# Patient Record
Sex: Female | Born: 1970 | Hispanic: Yes | Marital: Married | Smoking: Never smoker
Health system: Southern US, Community
[De-identification: ages and names within clinical notes are randomized; demographics above are authoritative.]

## PROBLEM LIST (undated history)

## (undated) DIAGNOSIS — E119 Type 2 diabetes mellitus without complications: Secondary | ICD-10-CM

## (undated) HISTORY — PX: CHOLECYSTECTOMY: SHX55

## (undated) HISTORY — PX: DILATION AND CURETTAGE OF UTERUS: SHX78

---

## 2017-07-14 ENCOUNTER — Other Ambulatory Visit (HOSPITAL_COMMUNITY): Payer: Self-pay | Admitting: Obstetrics and Gynecology

## 2017-07-14 DIAGNOSIS — Z3689 Encounter for other specified antenatal screening: Secondary | ICD-10-CM

## 2017-07-14 DIAGNOSIS — N888 Other specified noninflammatory disorders of cervix uteri: Secondary | ICD-10-CM

## 2017-07-14 DIAGNOSIS — Z3A16 16 weeks gestation of pregnancy: Secondary | ICD-10-CM

## 2017-07-24 ENCOUNTER — Encounter (HOSPITAL_COMMUNITY): Payer: Self-pay | Admitting: *Deleted

## 2017-07-25 ENCOUNTER — Ambulatory Visit (HOSPITAL_COMMUNITY)
Admission: RE | Admit: 2017-07-25 | Discharge: 2017-07-25 | Disposition: A | Discharge status: Home / Self Care | Payer: Medicaid Other | Source: Ambulatory Visit | Attending: Obstetrics and Gynecology | Admitting: Obstetrics and Gynecology

## 2017-07-25 ENCOUNTER — Other Ambulatory Visit (HOSPITAL_COMMUNITY): Payer: Self-pay | Admitting: Obstetrics and Gynecology

## 2017-07-25 ENCOUNTER — Encounter (HOSPITAL_COMMUNITY): Payer: Self-pay

## 2017-07-25 DIAGNOSIS — O99212 Obesity complicating pregnancy, second trimester: Secondary | ICD-10-CM | POA: Insufficient documentation

## 2017-07-25 DIAGNOSIS — Z363 Encounter for antenatal screening for malformations: Secondary | ICD-10-CM | POA: Insufficient documentation

## 2017-07-25 DIAGNOSIS — Z3689 Encounter for other specified antenatal screening: Secondary | ICD-10-CM

## 2017-07-25 DIAGNOSIS — N888 Other specified noninflammatory disorders of cervix uteri: Secondary | ICD-10-CM

## 2017-07-25 DIAGNOSIS — Z3A16 16 weeks gestation of pregnancy: Secondary | ICD-10-CM | POA: Insufficient documentation

## 2017-07-25 DIAGNOSIS — O09522 Supervision of elderly multigravida, second trimester: Secondary | ICD-10-CM | POA: Diagnosis not present

## 2017-07-25 DIAGNOSIS — O34219 Maternal care for unspecified type scar from previous cesarean delivery: Secondary | ICD-10-CM

## 2017-07-25 DIAGNOSIS — O24112 Pre-existing diabetes mellitus, type 2, in pregnancy, second trimester: Secondary | ICD-10-CM | POA: Diagnosis not present

## 2017-07-25 DIAGNOSIS — O2692 Pregnancy related conditions, unspecified, second trimester: Secondary | ICD-10-CM | POA: Diagnosis not present

## 2017-07-25 HISTORY — DX: Type 2 diabetes mellitus without complications: E11.9

## 2017-07-25 NOTE — Consult Note (Signed)
Maternal Fetal Medicine Consultation  Requesting Provider(s): Gaccione  Primary OB: Gaccione Reason for consultation: Cervical mass  HPI: 47 year old Hispanic female P 11 0 3 9 with AMA, Type 2 DM, referred for a mass that appears to be protruding from the cervix. She has not yet had any testing for aneuploidy. She states that the mass does not hurt but she has bleeding when she has to sneeze, cough, or after any pelvic examinations. OB History: OB History    Gravida Para Term Preterm AB Living   15 11 11   3 9    SAB TAB Ectopic Multiple Live Births   2 1     11       PMH:  Past Medical History:  Diagnosis Date  . Diabetes mellitus without complication (HCC)     PSH:  Past Surgical History:  Procedure Laterality Date  . CESAREAN SECTION    . CHOLECYSTECTOMY    . DILATION AND CURETTAGE OF UTERUS     Meds: PNV, metformin Allergies: NKDA FH: See EPIC section Soc: See EPIC section  Review of Systems: no vaginal bleeding at present, nor cramping/contractions, no LOF, no nausea/vomiting. All other systems reviewed and are negative.  PE:  VS: See EPIC section GEN: well-appearing female ABD: gravid, NT Pelvic: a speculum exam was performed. A 2-3 cm mass is seen protruding from the cervical os. It is not bleeding. It is soft and slightly friable  Please see separate document for fetal ultrasound report.  A/P: 1. Probable cervical polyp The base of this polyp is not fully visible and would be difficult to manage if the polyp was removed. At this time I would not intervene. If bleeding becomes an issue, it could be "lasso'ed" with several chromic endoloops around the stalk and excised. It may continue to cause nuisance bleeding but should not interfere with the pregnancy excessively. 2. AMA with worrisome US findings This patient has a baseline 1 in 16 risk for aneuploidy due to age. The thickened nuchal translucency and possible VSD greatly increase this risk. The risk for  cardiac defect is of course increased in a patient with type 2 DM. I have taken the liberty of scheduling a fetal echocardiogram to assess the fetal heart. I discussed the findings with the patient and her husband. They had previously declined aneuploidy testing per report, but are willing to undergo cell free DNA testing today. We will go ahead and draw a Panorama non-invasive test today.  I have asked her to follow up in 3 weeks for repeat scan. I have also made a genetic counseling appointment at that visit just in case the NIPT is positive for an aneuploidy  Thank you for the opportunity to be a part of the care of Rose Atkinson. Please contact our office if we can be of further assistance.   I spent approximately 30 minutes with this patient with over 50% of time spent in face-to-face counseling.

## 2017-07-25 NOTE — Addendum Note (Signed)
Encounter addended by: Vanetta ShawlIsley, Diamone Whistler H, RT, RVT, RDMS on: 07/25/2017 4:17 PM  Actions taken: Imaging Exam ended

## 2017-07-26 ENCOUNTER — Other Ambulatory Visit (HOSPITAL_COMMUNITY): Payer: Self-pay | Admitting: *Deleted

## 2017-07-26 DIAGNOSIS — O359XX0 Maternal care for (suspected) fetal abnormality and damage, unspecified, not applicable or unspecified: Secondary | ICD-10-CM

## 2017-07-29 ENCOUNTER — Other Ambulatory Visit: Payer: Self-pay

## 2017-08-04 ENCOUNTER — Other Ambulatory Visit: Payer: Self-pay

## 2017-08-04 ENCOUNTER — Telehealth (HOSPITAL_COMMUNITY): Payer: Self-pay | Admitting: MS"

## 2017-08-04 NOTE — Telephone Encounter (Signed)
Called Rose Atkinson with results of noninvasive prenatal screening (NIPS)/prenatal cell free DNA testing. Spanish/English medical interpreter [phone]#[id number] was present to provided interpretation as needed, but the patient indicated that she understood [place]. Patient was identified by name and DOB. [name] had NIPS given maternal age and abnormal ultrasound findings. We discussed that results of Panorama through [facility] are high risk for Trisomy 21 (Down syndrome). We reviewed that while not diagnostic, given the sensitivity and specificity of this test, the positive predictive value for this result in the current pregnancy is estimated to be >99%. Analysis for additional aneuploidy conditions was within normal limits, indicating low risk for trisomy 5018, trisomy 13, monosomy X, and 22q11.2 deletion syndrome. Additionally, NIPS indicated female fetal sex; the patient did want to know this information.  [name] is scheduled for follow-up ultrasound and genetic counseling in our office on 08/15/17. I offered her an earlier genetic counseling visit to review these results in more detail and to discuss available resources and available follow-up options. [name] declined a sooner appointment and preferred to wait for the 08/15/17 visit. We discussed one available resource, National Down Syndrome Congress, if she or relatives wanted to read more about Down syndrome prior to the appointment. She was encouraged to call back with questions or concerns.   Clydie BraunKaren Adylynn Hertenstein 08/04/2017 2:42 PM

## 2017-08-15 ENCOUNTER — Ambulatory Visit (HOSPITAL_COMMUNITY)
Admission: RE | Admit: 2017-08-15 | Discharge: 2017-08-15 | Disposition: A | Discharge status: Home / Self Care | Payer: Medicaid Other | Source: Ambulatory Visit | Attending: Obstetrics and Gynecology | Admitting: Obstetrics and Gynecology

## 2017-08-15 ENCOUNTER — Other Ambulatory Visit (HOSPITAL_COMMUNITY): Payer: Self-pay | Admitting: *Deleted

## 2017-08-15 ENCOUNTER — Encounter (HOSPITAL_COMMUNITY): Payer: Self-pay

## 2017-08-15 DIAGNOSIS — O34219 Maternal care for unspecified type scar from previous cesarean delivery: Secondary | ICD-10-CM | POA: Insufficient documentation

## 2017-08-15 DIAGNOSIS — O26892 Other specified pregnancy related conditions, second trimester: Secondary | ICD-10-CM | POA: Diagnosis not present

## 2017-08-15 DIAGNOSIS — Z7984 Long term (current) use of oral hypoglycemic drugs: Secondary | ICD-10-CM | POA: Diagnosis not present

## 2017-08-15 DIAGNOSIS — O3510X0 Maternal care for (suspected) chromosomal abnormality in fetus, unspecified, not applicable or unspecified: Secondary | ICD-10-CM | POA: Insufficient documentation

## 2017-08-15 DIAGNOSIS — Z362 Encounter for other antenatal screening follow-up: Secondary | ICD-10-CM | POA: Diagnosis not present

## 2017-08-15 DIAGNOSIS — O281 Abnormal biochemical finding on antenatal screening of mother: Secondary | ICD-10-CM | POA: Insufficient documentation

## 2017-08-15 DIAGNOSIS — O359XX0 Maternal care for (suspected) fetal abnormality and damage, unspecified, not applicable or unspecified: Secondary | ICD-10-CM | POA: Diagnosis not present

## 2017-08-15 DIAGNOSIS — O99212 Obesity complicating pregnancy, second trimester: Secondary | ICD-10-CM | POA: Diagnosis not present

## 2017-08-15 DIAGNOSIS — Z3A19 19 weeks gestation of pregnancy: Secondary | ICD-10-CM | POA: Insufficient documentation

## 2017-08-15 DIAGNOSIS — N889 Noninflammatory disorder of cervix uteri, unspecified: Secondary | ICD-10-CM | POA: Insufficient documentation

## 2017-08-15 DIAGNOSIS — E669 Obesity, unspecified: Secondary | ICD-10-CM | POA: Insufficient documentation

## 2017-08-15 DIAGNOSIS — O09522 Supervision of elderly multigravida, second trimester: Secondary | ICD-10-CM | POA: Diagnosis not present

## 2017-08-15 DIAGNOSIS — O289 Unspecified abnormal findings on antenatal screening of mother: Secondary | ICD-10-CM

## 2017-08-15 DIAGNOSIS — O351XX0 Maternal care for (suspected) chromosomal abnormality in fetus, not applicable or unspecified: Secondary | ICD-10-CM | POA: Insufficient documentation

## 2017-08-15 DIAGNOSIS — O24112 Pre-existing diabetes mellitus, type 2, in pregnancy, second trimester: Secondary | ICD-10-CM | POA: Insufficient documentation

## 2017-08-15 DIAGNOSIS — O09292 Supervision of pregnancy with other poor reproductive or obstetric history, second trimester: Secondary | ICD-10-CM | POA: Diagnosis not present

## 2017-08-15 NOTE — Progress Notes (Signed)
Genetic Counseling  High-Risk Gestation Note  Appointment Date:  08/15/2017 Referred By: Hassell Done, MD Date of Birth:  04/27/1971 Partner:  Rose Atkinson   Pregnancy History: [id number] Estimated Date of Delivery: 01/09/18 Estimated Gestational Age: [redacted]w[redacted]d Attending: Charlsie Merles, MD   Rose Atkinson and her partner, Mr. Rose Atkinson, were seen for genetic counseling because of a high risk Trisomy 21 result from noninvasive prenatal screening (NIPS)/prenatal cell free DNA testing. The patient will be 47 years old at delivery. The patient declined the use of medical interpreter. The couple's 57 year old daughter was also present for today's visit.     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)  Positive Predictive Value approximately >99%  Reviewed results of ultrasound: suspected fetal heart defect, increased nuchal fold, short long bones  Discussed additional screening options  Ongoing Ultrasound  Fetal Echocardiogram- scheduled 2/21 [facility])  Discussed diagnostic testing options  Amniocentesis- declined  Postnatal chromosome analysis- couple would like to pursue this option  Reviewed family history concerns  Discussed ACOG recommended carrier screening options- declined  Rose Atkinson was previously [facility]. Ultrasound at that time visualized increased nuchal fold measurement and suspected fetal heart defect. Given ultrasound findings and advanced maternal age, the patient was offered and elected to pursue noninvasive prenatal screening (NIPS)/prenatal cell free DNA testing. We reviewed that results of this screening, Panorama through [facility] laboratory, indicated high risk for Trisomy 21 in the current pregnancy.   Follow-up ultrasound was performed today. Increased  nuchal fold measurement and shortened fetal long bones were visualized. Suspected fetal ventricular septal defect (VSD) was again visualized. Complete ultrasound results reported under separate cover. Follow-up ultrasound scheduled 09/11/17, and fetal echocardiogram with [facility] is scheduled for 09/07/17.   We spent time reviewing the methodology of NIPS, including the sensitivity and specificity. The patient's high risk Trisomy 21 result is reported as a 9 in 10 risk. We discussed that this testing identifies >99% of pregnancies with Down syndrome with a false positive rate of approximately 0.1%. We reviewed that this testing is highly sensitive and specific, but is not considered diagnostic. We reviewed that the positive predictive value (PPV) is the percentage of those patients who have a positive NIPS result who actually have fetal Down syndrome. Specific to this laboratory and the patient's age, the PPV of this result is >99%. We spend significant time reviewing this technology and the accuracy of NIPS and other screening versus diagnostic tests.  They were counseled that given the ultrasound findings and the positive cell free DNA test result, the suspected diagnosis of Down syndrome is likely accurate. We reviewed that the prenatal cell free DNA test can not distinguish between aneuploidy confined to the placenta, trisomy 21, translocation 21, or mosaic trisomy 21. We discussed the availability of amniocentesis or peripheral blood chromosome analysis for confirmation of the diagnosis. We reviewed risks, benefits, and limitations of amniocentesis including the associate risk for complications. This couple declined the option of amniocentesis. They wish to pursue postnatal confirmatory testing.   Considering the very high suspicion of Down syndrome, they were counseled in detail regarding this diagnosis. We discussed that there are different types of Down syndrome, and each type is  determined by the arrangement of the #21 chromosomes. Approximately 95% of cases of Down syndrome are trisomy 50 and  2-4% are due to a translocation involving chromosome 21. We reviewed chromosomes, nondisjunction, and that chromosome division errors happen by chance and are not usually inherited. They understand that confirmatory testing will provide an actual karyotype and will help to determine accurate risks of recurrence for a future pregnancy.   This couple was counseled that Down syndrome occurs once per every 800 births and is associated with specific features. However, there is a high degree of variability seen among children who have this condition, meaning that every child with Down syndrome will not be affected in exactly the same way, and some children will have more or less features than others. They were counseled that half of individuals with Down syndrome have a cardiac anomaly and ~10-15% have an intestinal difference. Other anomalies of various organ systems have also been described in association with this diagnosis.   We discussed that in general most individuals with Down syndrome have mild to moderate intellectual disability and likely will require extra assistance with school work. Approximately 70-80% of children with Down syndrome have hypotonia which may lead to delays in sitting, walking, and talking. Hypotonia does tend to improve with age and early intervention services such as physical, occupational, and speech therapies can help with achievement of developmental milestones. We discussed that these therapies are typically provided to children (with qualifying diagnoses) from birth until age 413. We also discussed that Down syndrome is associated with characteristic facial features. Because of these facial features, many children with Down syndrome look similar to each other, but they were reminded that each child with Down syndrome is unique and will have many more features in common  with his or her own family members.   We also reviewed that the AAP have established health supervision guidelines for individuals with Down syndrome. These guidelines provide medical management recommendations for various stages of life and can be a resource for families who have a child with Down syndrome as well as for health professionals. We also discussed that providing children with Down syndrome with a stimulating physical and social environment, as well as ensuring that the child receives adequate medical care and appropriate therapies, will help these children to reach their full potential. With the advances in medical technology, early intervention, and supportive therapies, many individuals with Down syndrome are able to live with an increasing degree of independence. Today, many adults with Down syndrome care for themselves, have jobs, and often times live in group homes or apartments where assistance is available if needed.   We discussed local and national support organizations and provided written resources in [place] and Spanish to the couple. Additionally, we discussed that postnatal health management can be coordinated by a medical geneticist as well as with a multidisciplinary team of physicians (Down syndrome clinic). We also discussed options of terminating the pregnancy, adoption, or continuing the pregnancy. The couple plans to continue the pregnancy and are not considering other options. This couple was encouraged to contact us if they have any additional questions/concerns or if they need additional supportive or informational recommendations.   Ms. Michail Jewelsomasa Finfrock was provided with written information regarding cystic fibrosis (CF), spinal muscular atrophy (SMA) and hemoglobinopathies including the carrier frequency, availability of carrier screening and prenatal diagnosis if indicated.  In addition, we discussed that CF and hemoglobinopathies are routinely screened for as part of  the [place] newborn screening panel.  She declined screening for CF, SMA and hemoglobinopathies.  Both family histories were reviewed and found to be contributory  for the patient's first child with hydrocephalus who died at age 47 months. She reported that her next pregnancy with that same partner was identified on prenatal ultrasound to have abnormal cerebral ventricles, and the pregnancy was terminated. She reported no additional children with hydrocephalus. The patient had one son, with a different partner from the previously mentioned partner, who died at age 33 month due to whooping cough. She reported that her remaining 8 children are in good health.   Hydrocephalus can be isolated (nonsyndromic) or seen as one feature of an underlying chromosome or genetic condition. Hydrocephalus is typically isolated and multifactorial, involving a combination of genetic and environmental contributing factors.  Rarely, nonsyndromic hydrocephalus can follow autosomal recessive or autosomal dominant inheritance. X-linked hydrocephalus is also observed in some families. We discussed that when isolated and when multifactorial inheritance is suspected, recurrence risk for full siblings is approximately 1-2%. Additional information regarding an underlying cause for hydrocephalus in the family may alter recurrence risk assessment. Targeted ultrasound is available to assess for features of hydrocephalus prenatally. However, the patient understands that ultrasound cannot diagnose or rule out all birth defects prenatally.   The patient also reported a niece (her sister's daughter) born with only one kidney. The niece is reportedly healthy. No additional relatives were reported with congenital defects of the kidney. Renal agenesis is typically sporadic. However, there are familial cases reported that are consistent with autosomal dominant inheritance. Additionally, prenatal exposures such as warfarin, cocaine, and maternal diabetes  have been reported to be associated with renal agenesis. The reported family history would most likely indicate low recurrence risk for the patient's offspring for renal agenesis. Without further information regarding the provided family history, an accurate genetic risk cannot be calculated. Further genetic counseling is warranted if more information is obtained.  Ms. Darra Rosa denied exposure to environmental toxins or chemical agents. She denied the use of alcohol, tobacco or street drugs. She denied significant viral illnesses during the course of her pregnancy.  I counseled this couple regarding the above risks and available options.  The approximate face-to-face time with the genetic counselor was 45 minutes.  Quinn Plowman, MS,  Certified Genetic Counselor 08/15/2017

## 2017-08-15 NOTE — Addendum Note (Signed)
Encounter addended by: Melynda KellerVics, Lavonte Palos R, RDMS on: 08/15/2017 11:22 AM  Actions taken: Imaging Exam ended

## 2017-09-07 ENCOUNTER — Encounter (HOSPITAL_COMMUNITY): Payer: Self-pay

## 2017-09-11 ENCOUNTER — Inpatient Hospital Stay (HOSPITAL_COMMUNITY)
Admission: RE | Admit: 2017-09-11 | Discharge: 2017-09-11 | Disposition: A | Discharge status: Home / Self Care | Payer: Medicaid Other | Source: Ambulatory Visit

## 2017-09-13 ENCOUNTER — Encounter (HOSPITAL_COMMUNITY): Payer: Self-pay

## 2017-09-13 ENCOUNTER — Other Ambulatory Visit (HOSPITAL_COMMUNITY): Payer: Self-pay | Admitting: Obstetrics and Gynecology

## 2017-09-13 ENCOUNTER — Ambulatory Visit (HOSPITAL_COMMUNITY)
Admission: RE | Admit: 2017-09-13 | Discharge: 2017-09-13 | Disposition: A | Discharge status: Home / Self Care | Payer: Medicaid Other | Source: Ambulatory Visit | Attending: Obstetrics and Gynecology | Admitting: Obstetrics and Gynecology

## 2017-09-13 DIAGNOSIS — E119 Type 2 diabetes mellitus without complications: Secondary | ICD-10-CM | POA: Insufficient documentation

## 2017-09-13 DIAGNOSIS — N889 Noninflammatory disorder of cervix uteri, unspecified: Secondary | ICD-10-CM | POA: Insufficient documentation

## 2017-09-13 DIAGNOSIS — O289 Unspecified abnormal findings on antenatal screening of mother: Secondary | ICD-10-CM

## 2017-09-13 DIAGNOSIS — O24112 Pre-existing diabetes mellitus, type 2, in pregnancy, second trimester: Secondary | ICD-10-CM

## 2017-09-13 DIAGNOSIS — O09522 Supervision of elderly multigravida, second trimester: Secondary | ICD-10-CM | POA: Diagnosis not present

## 2017-09-13 DIAGNOSIS — O281 Abnormal biochemical finding on antenatal screening of mother: Secondary | ICD-10-CM

## 2017-09-13 DIAGNOSIS — O26892 Other specified pregnancy related conditions, second trimester: Secondary | ICD-10-CM | POA: Diagnosis not present

## 2017-09-13 DIAGNOSIS — O99210 Obesity complicating pregnancy, unspecified trimester: Secondary | ICD-10-CM

## 2017-09-13 DIAGNOSIS — Z362 Encounter for other antenatal screening follow-up: Secondary | ICD-10-CM | POA: Diagnosis not present

## 2017-09-13 DIAGNOSIS — Z3A23 23 weeks gestation of pregnancy: Secondary | ICD-10-CM | POA: Insufficient documentation

## 2017-09-13 DIAGNOSIS — Z794 Long term (current) use of insulin: Secondary | ICD-10-CM | POA: Diagnosis not present

## 2017-09-13 DIAGNOSIS — Z363 Encounter for antenatal screening for malformations: Secondary | ICD-10-CM

## 2017-09-13 DIAGNOSIS — O99212 Obesity complicating pregnancy, second trimester: Secondary | ICD-10-CM | POA: Diagnosis not present

## 2017-09-13 DIAGNOSIS — O09292 Supervision of pregnancy with other poor reproductive or obstetric history, second trimester: Secondary | ICD-10-CM | POA: Diagnosis not present

## 2017-09-13 DIAGNOSIS — E669 Obesity, unspecified: Secondary | ICD-10-CM | POA: Insufficient documentation

## 2017-09-13 DIAGNOSIS — O34219 Maternal care for unspecified type scar from previous cesarean delivery: Secondary | ICD-10-CM | POA: Diagnosis not present

## 2017-09-13 DIAGNOSIS — O359XX0 Maternal care for (suspected) fetal abnormality and damage, unspecified, not applicable or unspecified: Secondary | ICD-10-CM | POA: Insufficient documentation

## 2017-09-14 ENCOUNTER — Other Ambulatory Visit (HOSPITAL_COMMUNITY): Payer: Self-pay | Admitting: *Deleted

## 2017-09-14 DIAGNOSIS — O289 Unspecified abnormal findings on antenatal screening of mother: Secondary | ICD-10-CM

## 2017-10-12 ENCOUNTER — Ambulatory Visit (HOSPITAL_COMMUNITY)
Admission: RE | Admit: 2017-10-12 | Discharge: 2017-10-12 | Disposition: A | Discharge status: Home / Self Care | Payer: Medicaid Other | Source: Ambulatory Visit | Attending: Obstetrics and Gynecology | Admitting: Obstetrics and Gynecology

## 2017-10-12 ENCOUNTER — Other Ambulatory Visit (HOSPITAL_COMMUNITY): Payer: Self-pay | Admitting: Obstetrics and Gynecology

## 2017-10-12 ENCOUNTER — Encounter (HOSPITAL_COMMUNITY): Payer: Self-pay

## 2017-10-12 DIAGNOSIS — O34219 Maternal care for unspecified type scar from previous cesarean delivery: Secondary | ICD-10-CM | POA: Insufficient documentation

## 2017-10-12 DIAGNOSIS — O289 Unspecified abnormal findings on antenatal screening of mother: Secondary | ICD-10-CM | POA: Insufficient documentation

## 2017-10-12 DIAGNOSIS — O09292 Supervision of pregnancy with other poor reproductive or obstetric history, second trimester: Secondary | ICD-10-CM | POA: Insufficient documentation

## 2017-10-12 DIAGNOSIS — O09522 Supervision of elderly multigravida, second trimester: Secondary | ICD-10-CM | POA: Diagnosis not present

## 2017-10-12 DIAGNOSIS — E669 Obesity, unspecified: Secondary | ICD-10-CM | POA: Diagnosis not present

## 2017-10-12 DIAGNOSIS — O358XX0 Maternal care for other (suspected) fetal abnormality and damage, not applicable or unspecified: Secondary | ICD-10-CM | POA: Insufficient documentation

## 2017-10-12 DIAGNOSIS — E119 Type 2 diabetes mellitus without complications: Secondary | ICD-10-CM | POA: Diagnosis not present

## 2017-10-12 DIAGNOSIS — O24112 Pre-existing diabetes mellitus, type 2, in pregnancy, second trimester: Secondary | ICD-10-CM | POA: Insufficient documentation

## 2017-10-12 DIAGNOSIS — Z3A27 27 weeks gestation of pregnancy: Secondary | ICD-10-CM | POA: Diagnosis not present

## 2017-10-12 DIAGNOSIS — N889 Noninflammatory disorder of cervix uteri, unspecified: Secondary | ICD-10-CM | POA: Diagnosis not present

## 2017-10-12 DIAGNOSIS — O99212 Obesity complicating pregnancy, second trimester: Secondary | ICD-10-CM | POA: Diagnosis not present

## 2017-10-12 DIAGNOSIS — O26892 Other specified pregnancy related conditions, second trimester: Secondary | ICD-10-CM | POA: Insufficient documentation

## 2017-10-12 NOTE — Addendum Note (Signed)
Encounter addended by: Jaystin Mcgarvey, RDMS on: 10/12/2017 12:44 PM  Actions taken: Imaging Exam ended

## 2017-11-08 ENCOUNTER — Ambulatory Visit (HOSPITAL_COMMUNITY): Admission: RE | Admit: 2017-11-08 | Payer: Medicaid Other | Source: Ambulatory Visit

## 2018-01-16 ENCOUNTER — Encounter (HOSPITAL_COMMUNITY): Payer: Self-pay

## 2018-04-03 ENCOUNTER — Encounter (HOSPITAL_COMMUNITY): Payer: Self-pay

## 2018-12-22 IMAGING — US US MFM OB TRANSVAGINAL
1 series · 13 of 28 positions shown · non-contrast
Comparison: none

[Series 1: us mfm ob transvaginal · 13 of 77 slices shown]
[im 3/77]
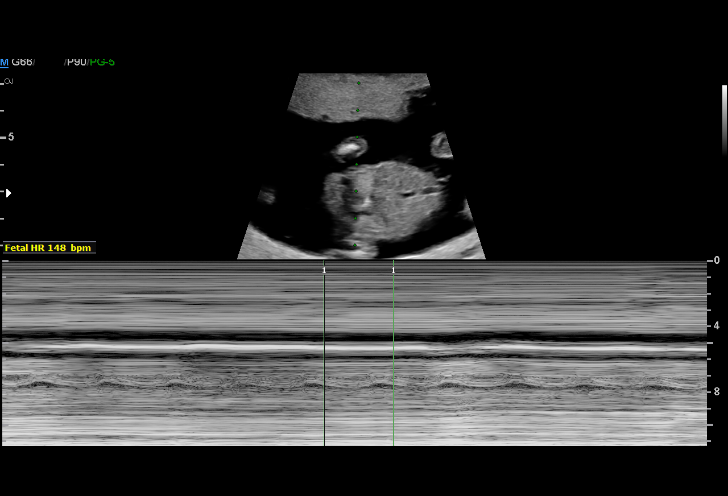
[im 9/77]
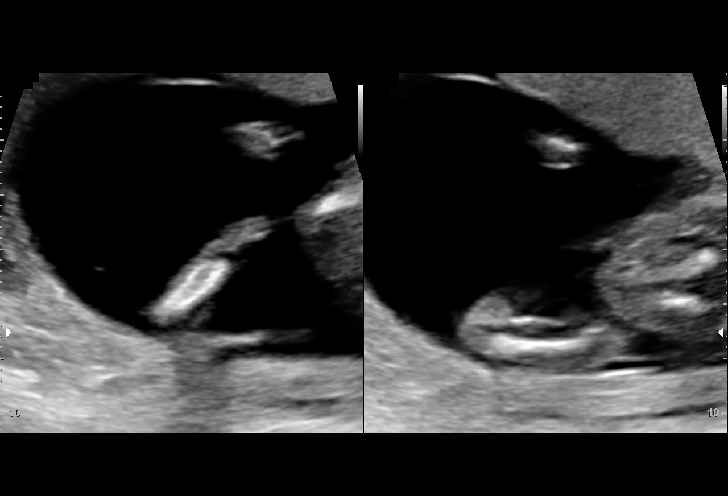
[im 15/77]
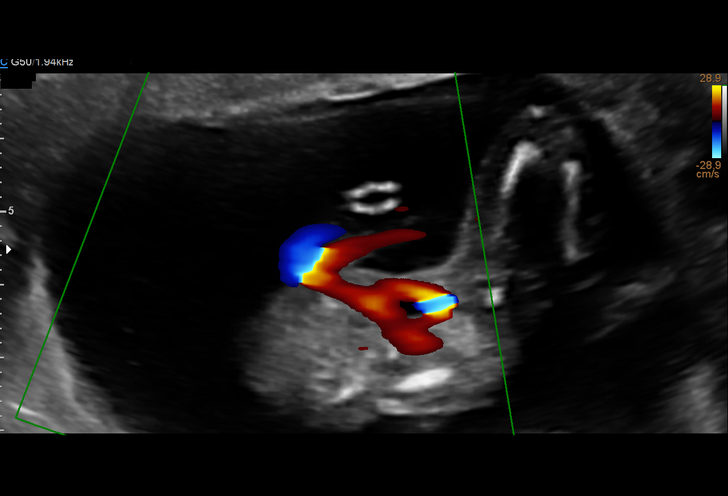
[im 20/77]
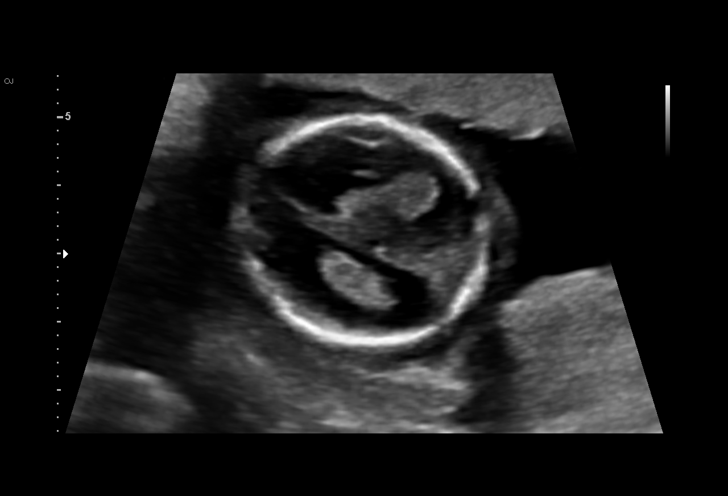
[im 26/77]
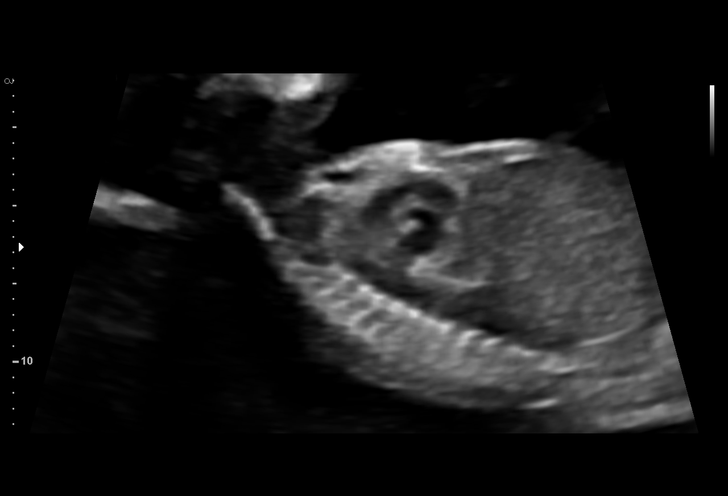
[im 31/77]
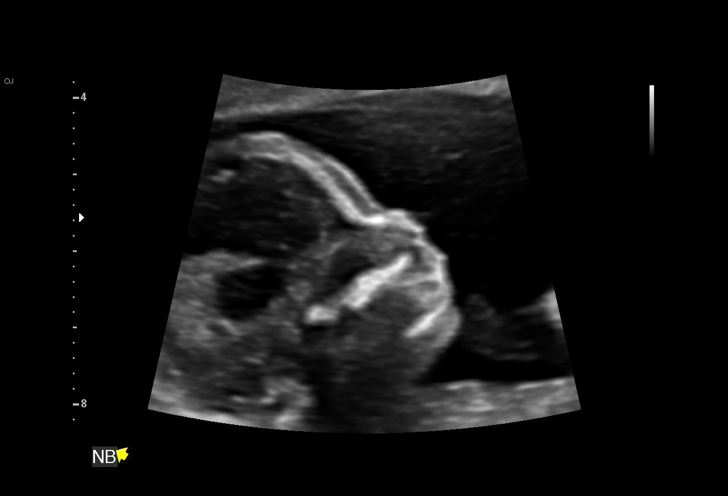
[im 40/77]
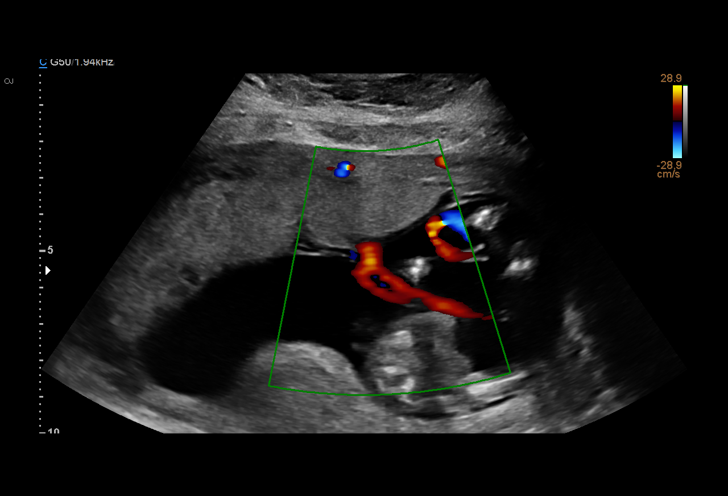
[im 46/77]
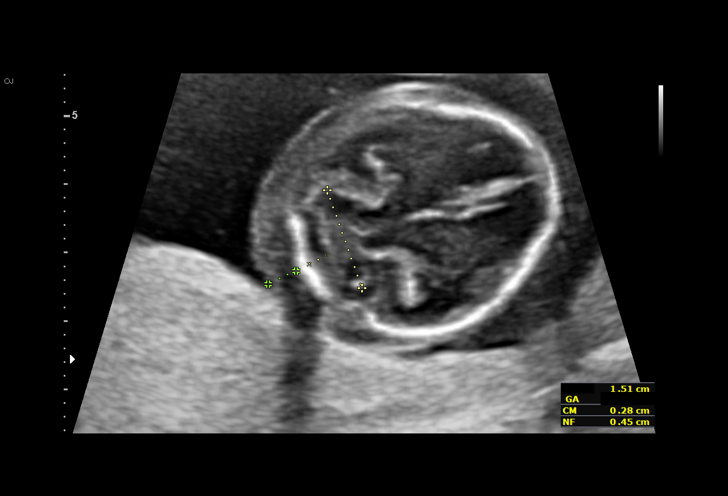
[im 51/77]
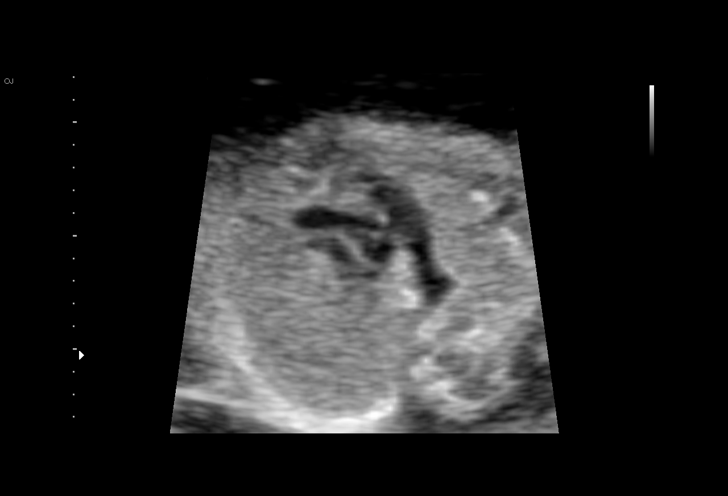
[im 57/77]
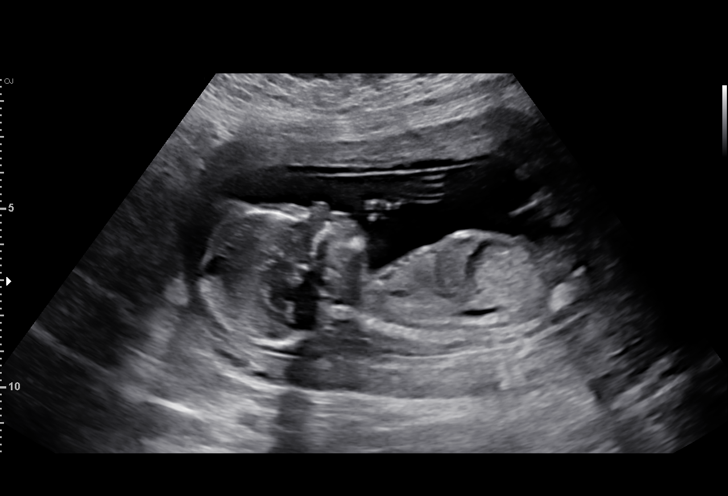
[im 62/77]
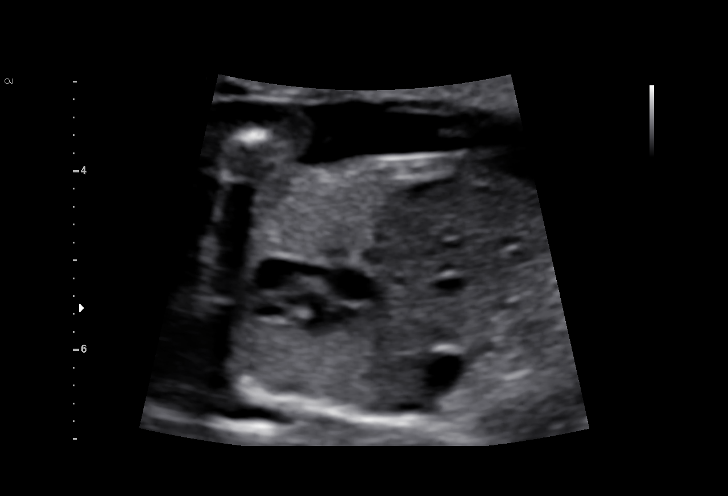
[im 68/77]
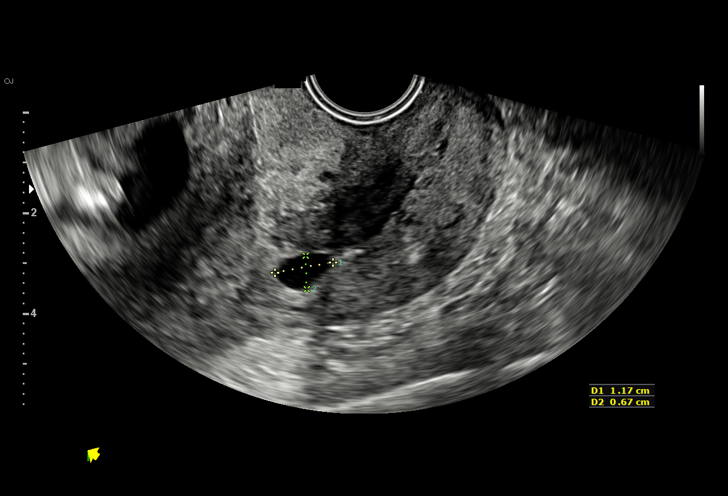
[im 74/77]
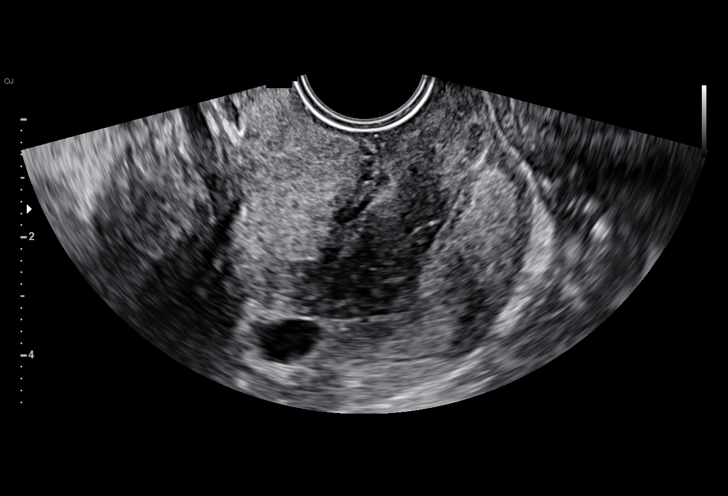

[13 of 28 positions shown; findings below may reference images not displayed]

1  JUWAIDI TEON           [id number]      [id number]     [id number]
2  JUWAIDI TEON           [id number]      [id number]     [id number]
Indications

16 weeks gestation of pregnancy
Encounter for antenatal screening for
malformations
Advanced maternal age multigravida (46),
second trimester -declined testing
Obesity complicating pregnancy, second
trimester
Medical complication of pregnancy (cervical
mass)
Pre-existing diabetes, type 2, in pregnancy,
second trimester -metformin
Previous cesarean delivery, antepartum x3
OB History

Blood Type:            Height:  5'1"   Weight (lb):  187       BMI:
Gravidity:    14        Term:   11       Prem:   0        SAB:   2
TOP:          1       Ectopic:  0        Living: 9
Fetal Evaluation

Num Of Fetuses:     1
Fetal Heart         148
Rate(bpm):
Cardiac Activity:   Observed
Presentation:       Variable
Placenta:           Anterior, above cervical os
P. Cord Insertion:  Visualized
Amniotic Fluid
AFI FV:      Subjectively within normal limits

Largest Pocket(cm)
5.4
Biometry

BPD:      32.5  mm     G. Age:  16w 1d         53  %    CI:        81.41   %    70 - 86
FL/HC:      15.0   %    13.3 -
HC:      113.7  mm     G. Age:  15w 4d         15  %    HC/AC:      1.04        1.05 -
AC:      109.1  mm     G. Age:  16w 6d         77  %    FL/BPD:     52.6   %
FL:       17.1  mm     G. Age:  15w 0d         13  %    FL/AC:      15.7   %    20 - 24
HUM:      17.2  mm     G. Age:  15w 0d         19  %
CER:      14.8  mm     G. Age:  14w 6d         23  %
NFT:       5.5  mm
CM:        2.9  mm

Est. FW:     139  gm      0 lb 5 oz     66  %
Gestational Age

U/S Today:     15w 6d                                        EDD:   01/10/18
Best:          16w 0d     Det. By:  Early Ultrasound         EDD:   01/09/18
(05/23/17)
Anatomy

Cranium:               Appears normal         Aortic Arch:            Not well visualized
Cavum:                 Appears normal         Ductal Arch:            Appears normal
Ventricles:            Appears normal         Diaphragm:              Appears normal
Choroid Plexus:        Appears normal         Stomach:                Appears normal, left
sided
Cerebellum:            Appears normal         Abdomen:                Appears normal
Posterior Fossa:       Appears normal         Abdominal Wall:         Appears nml (cord
insert, abd wall)
Nuchal Fold:           Appears enlarged       Cord Vessels:           Appears normal (3
vessel cord)
Face:                  Appears normal         Kidneys:                Appear normal
(orbits and profile)
Lips:                  Not well visualized    Bladder:                Appears normal
Thoracic:              Appears normal         Spine:                  Not well visualized
Heart:                 ?VSD                   Upper Extremities:      Visualized
RVOT:                  Appears normal         Lower Extremities:      Visualized
LVOT:                  Appears normal

Other:  Fetus appears to be a female. Technically difficult due to early
gestational age.
Cervix Uterus Adnexa

Cervix
Appears closed. ?cervical polyp?

Uterus
No abnormality visualized.

Left Ovary
Not visualized.
Right Ovary
Not visualized.

Cul De Sac:   No free fluid seen..

Adnexa:       No abnormality visualized.
Impression

Singleton intrauterine pregnancy at 16+0 weeks with AMA
and cervical mass
Review of the anatomy shows several sonographic markers
for aneuploidy. there appears to be a thickened nuchal fold at
5mm. Although we cannot capture 2 views, the 4-chamger
view is very suspicious for a VSD.
However, evaluations should be considered suboptimal
secondary to early gestational age
Amniotic fluid volume is normal
Estimated fetal weight is 136g which is growth in the 66th
percentile
Transvaginal evaluation show a transcervical mass with what
appears to be "core" feeder vessels
Recommendations

See MFM Consult

## 2019-03-11 IMAGING — US US MFM OB FOLLOW-UP
1 series · 13 of 28 positions shown · non-contrast
Comparison: none

[Series 1: us mfm ob follow-up · 33 acquisitions, 13 frames shown]
[im 2/33]
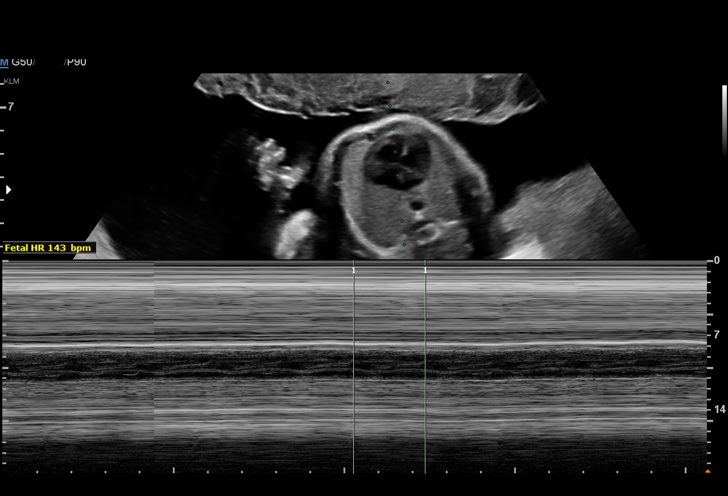
[im 4/33]
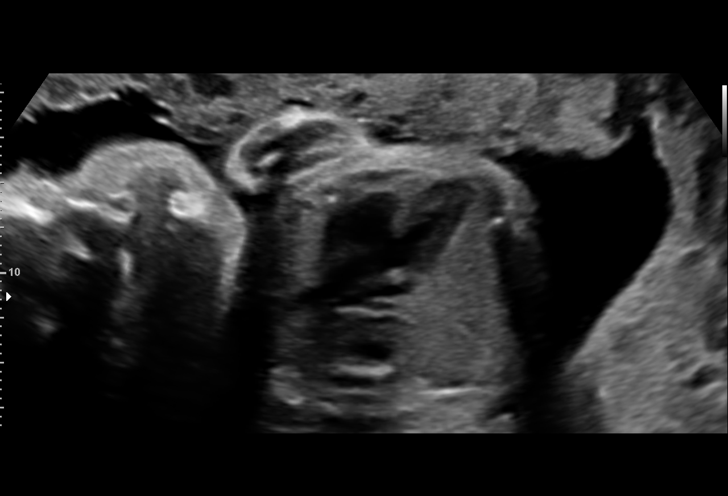
[im 6/33]
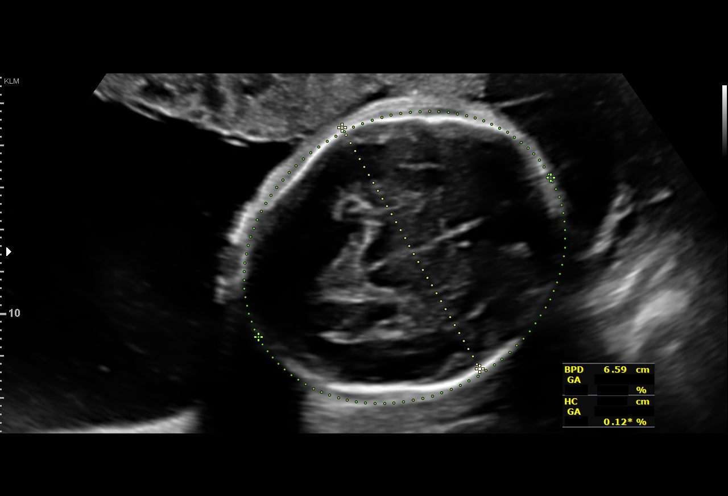
[im 9/33]
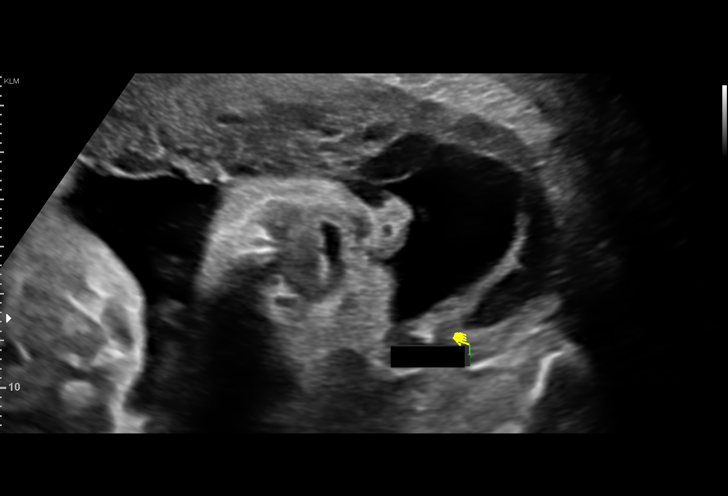
[im 11/33]
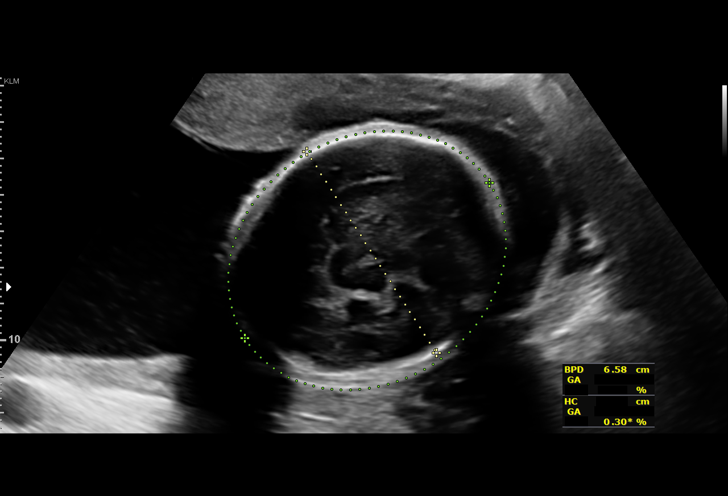
[im 14/33]
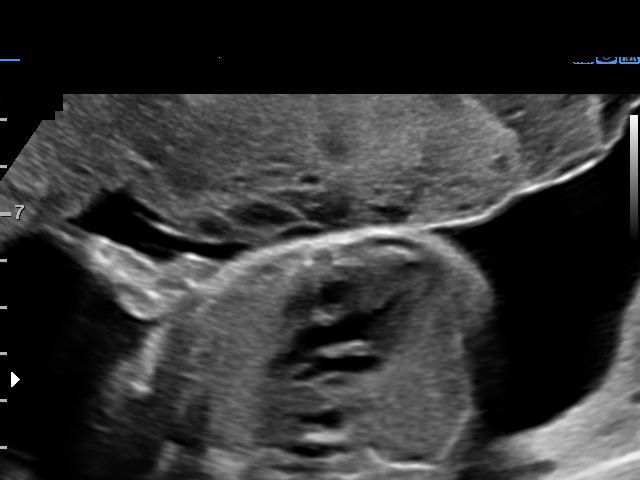
[im 17/33]
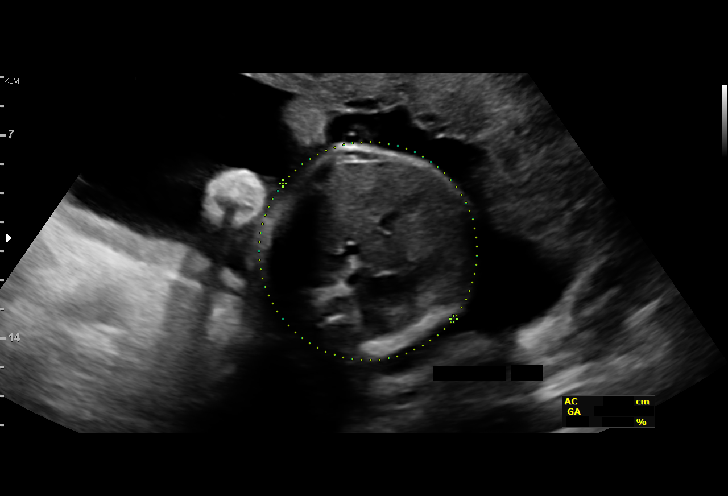
[im 19/33]
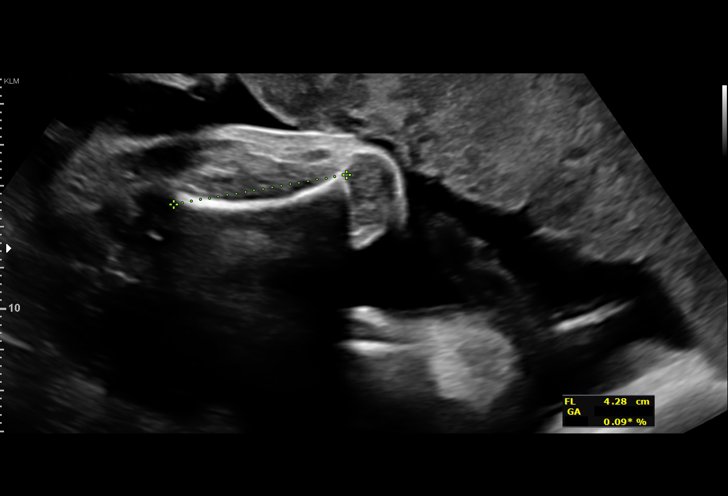
[im 22/33]
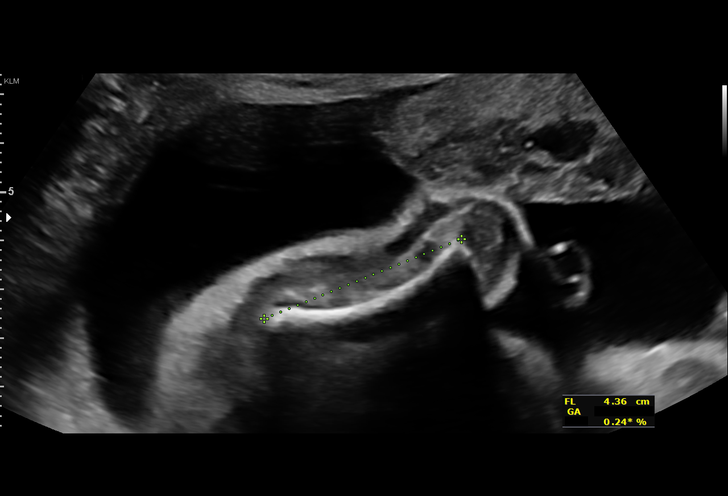
[im 24/33]
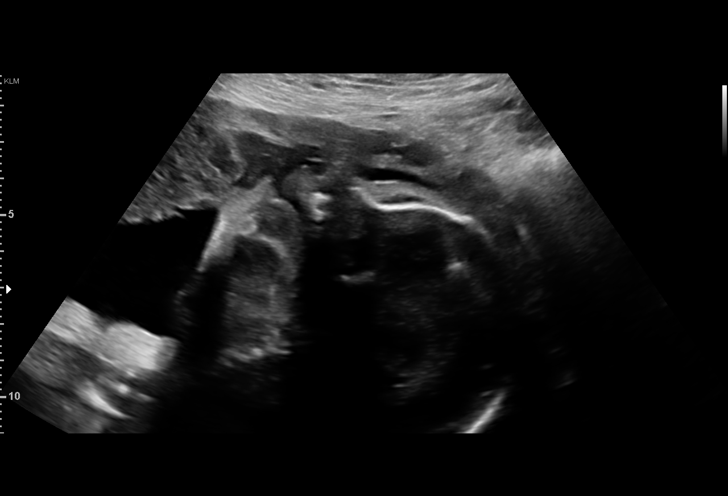
[im 27/33]
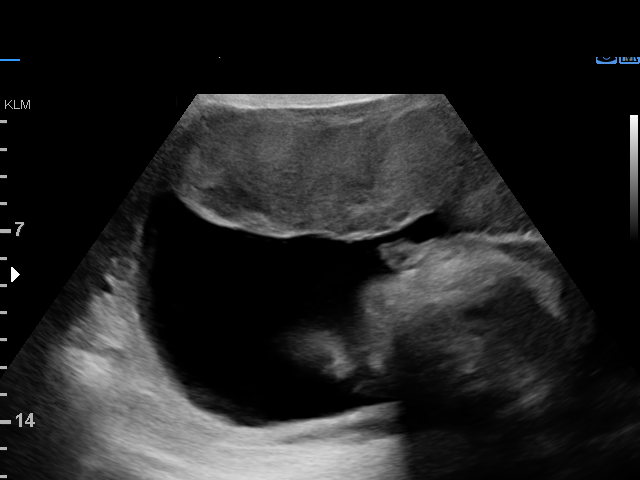
[im 29/33]
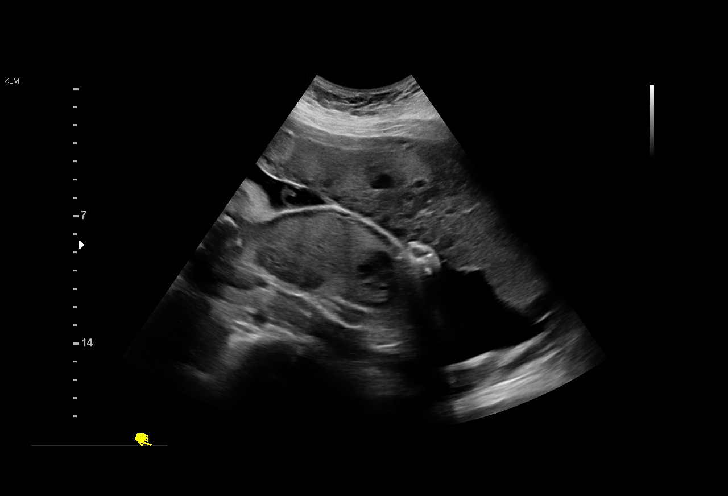
[im 31/33]
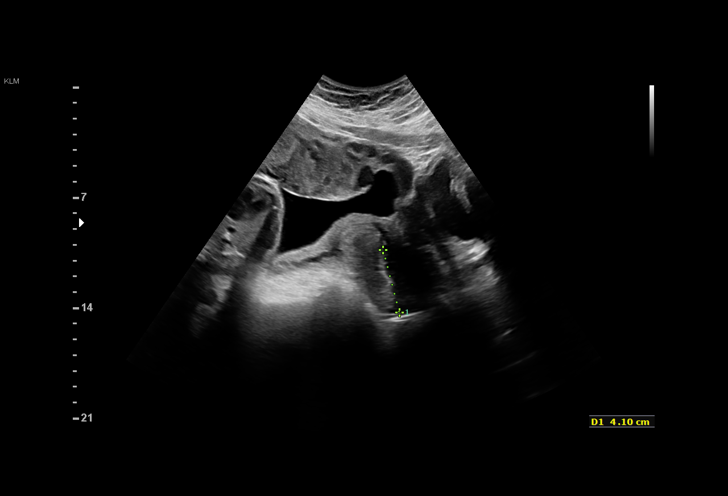

[13 of 28 positions shown; findings below may reference images not displayed]

1  FAWZIA JOURDAIN            [id number]      [id number]     [id number]
Indications

27 weeks gestation of pregnancy
Advanced maternal age multigravida (46),
second trimester
Obesity complicating pregnancy, second
trimester
Medical complication of pregnancy (cervical
mass)
Pre-existing diabetes, type 2, in pregnancy,
second trimester -insulin
Previous cesarean delivery, antepartum x3
Abnormal finding on antenatal screening
(NIPS HIGH RISK T21-Declined further
testing)
Poor obstetric history: Previous neonatal
death (x2, Pneumonia, hydrocephalus)
Fetal abnormality - other known or
suspected (Echo- complete balanced AVSD
with large inlet VSD, large ASD)
OB History

Blood Type:            Height:  5'1"   Weight (lb):  187       BMI:
Gravidity:    15        Term:   11       Prem:   0        SAB:   2
TOP:          1       Ectopic:  0        Living: 9
Fetal Evaluation

Num Of Fetuses:     1
Fetal Heart         143
Rate(bpm):
Cardiac Activity:   Observed
Presentation:       Transverse, head to maternal left
Placenta:           Anterior, above cervical os
P. Cord Insertion:  Previously Visualized
Amniotic Fluid
AFI FV:      Subjectively within normal limits

Largest Pocket(cm)
8
Biometry

BPD:      65.8  mm     G. Age:  26w 4d         17  %    CI:         79.1   %    70 - 86
FL/HC:      19.2   %    18.6 -
HC:      233.9  mm     G. Age:  25w 3d        < 3  %    HC/AC:      1.00        1.05 -
AC:      234.7  mm     G. Age:  27w 5d         58  %    FL/BPD:     68.1   %    71 - 87
FL:       44.8  mm     G. Age:  24w 6d        < 3  %    FL/AC:      19.1   %    20 - 24

Est. FW:     938  gm      2 lb 1 oz     34  %
Gestational Age

U/S Today:     26w 1d                                        EDD:   01/17/18
Best:          27w 2d     Det. By:  Early Ultrasound         EDD:   01/09/18
(05/23/17)
Anatomy

Cranium:               Appears normal         Aortic Arch:            Previously seen
Cavum:                 Previously seen        Ductal Arch:            Previously seen
Ventricles:            Previously seen        Diaphragm:              Previously seen
Choroid Plexus:        Previously seen        Stomach:                Appears normal, left
sided
Cerebellum:            Appears normal         Abdomen:                Previously seen
Posterior Fossa:       Previously seen        Abdominal Wall:         Previously seen
Nuchal Fold:           Previously seen        Cord Vessels:           Previously seen
Face:                  Orbits and profile     Kidneys:                Previously seen
previously seen
Lips:                  Appears normal         Bladder:                Appears normal
Thoracic:              Appears normal         Spine:                  Previously seen
Heart:                 Abnormal, see          Upper Extremities:      Previously seen
comments
RVOT:                  Appears normal         Lower Extremities:      Previously seen
LVOT:                  Appears normal

Other:  Fetus appears to be a female prev seen. Technically difficult due to
maternal habitus, fetal position, and fetal movement.
Cervix Uterus Adnexa

Cervix
Length:              4  cm.
Normal appearance by transabdominal scan.
Impression

Singleton intrauterine pregnancy at 27+2 weeks with fetal
cardiac defect and high-risk NIPT for Trisomy 21 here for
repeat evaluation
Interval review of the anatomy shows continued evidence of a
balanced AV canal defect. No other sonographic markers for
aneuploidy or structural anomalies are seen
All relevant fetal anatomy has been visualized
Amniotic fluid volume is normal
Estimated fetal weight shows growth in the 34th percentile
Recommendations

Recommend follow-up ultrasound examination in 4 weeks for
repeat growth evaluation
